# Patient Record
Sex: Male | Born: 1985 | Race: White | Hispanic: No | Marital: Married | Smoking: Never smoker
Health system: Southern US, Community
[De-identification: ages and names within clinical notes are randomized; demographics above are authoritative.]

## PROBLEM LIST (undated history)

## (undated) DIAGNOSIS — Z789 Other specified health status: Secondary | ICD-10-CM

## (undated) HISTORY — PX: NO PAST SURGERIES: SHX2092

## (undated) HISTORY — DX: Other specified health status: Z78.9

---

## 2020-01-13 ENCOUNTER — Ambulatory Visit: Payer: Self-pay | Admitting: Family Medicine

## 2020-01-21 ENCOUNTER — Other Ambulatory Visit: Payer: Self-pay

## 2020-01-21 ENCOUNTER — Ambulatory Visit (INDEPENDENT_AMBULATORY_CARE_PROVIDER_SITE_OTHER): Payer: Managed Care, Other (non HMO) | Admitting: Family Medicine

## 2020-01-21 ENCOUNTER — Encounter: Payer: Self-pay | Admitting: Family Medicine

## 2020-01-21 VITALS — BP 131/77 | HR 88 | Ht 67.0 in | Wt 191.0 lb

## 2020-01-21 DIAGNOSIS — G8929 Other chronic pain: Secondary | ICD-10-CM | POA: Insufficient documentation

## 2020-01-21 DIAGNOSIS — R6882 Decreased libido: Secondary | ICD-10-CM

## 2020-01-21 DIAGNOSIS — M5442 Lumbago with sciatica, left side: Secondary | ICD-10-CM

## 2020-01-21 DIAGNOSIS — M5440 Lumbago with sciatica, unspecified side: Secondary | ICD-10-CM

## 2020-01-21 DIAGNOSIS — Z13220 Encounter for screening for lipoid disorders: Secondary | ICD-10-CM

## 2020-01-21 DIAGNOSIS — Z0000 Encounter for general adult medical examination without abnormal findings: Secondary | ICD-10-CM

## 2020-01-21 NOTE — Assessment & Plan Note (Signed)
Will order updated xrays.  Will consider MRI of lumbar spine and/or PT as well if symptoms persist.

## 2020-01-21 NOTE — Patient Instructions (Signed)
Great to meet you today! Have labs and xray completed.  Follow up for annual exam after completion of labs.

## 2020-01-21 NOTE — Progress Notes (Signed)
Tony Oliver - 34 y.o. male MRN [id number]  Date of birth: 1985-08-17  Subjective No chief complaint on file.   HPI Tony Oliver is a 34 y.o. male here today for initial visit.  He has a history of chronic low back pain.  He recently moved to the area from [place].    Back pain located across center of back, often flares up more on R side.  He does often have sciatica that accompanies his flares of back pain.  He had xray completed while in [place] which showed some degenerative changes and disc space narrowing.  He has not had MRI or tried PT.   He would like have labs ordered for upcoming annual exam as well.   ROS:  A comprehensive ROS was completed and negative except as noted per HPI  No Known Allergies  Past Medical History:  Diagnosis Date  . No pertinent past medical history     Past Surgical History:  Procedure Laterality Date  . NO PAST SURGERIES      Social History   Socioeconomic History  . Marital status: Married    Spouse name: Not on file  . Number of children: Not on file  . Years of education: Not on file  . Highest education level: Not on file  Occupational History  . Not on file  Tobacco Use  . Smoking status: Never Smoker  . Smokeless tobacco: Never Used  Substance and Sexual Activity  . Alcohol use: Yes    Alcohol/week: 4.0 standard drinks    Types: 4 Standard drinks or equivalent per week  . Drug use: Never  . Sexual activity: Yes  Other Topics Concern  . Not on file  Social History Narrative  . Not on file   Social Determinants of Health   Financial Resource Strain:   . Difficulty of Paying Living Expenses: Not on file  Food Insecurity:   . Worried About Programme researcher, broadcasting/film/video in the Last Year: Not on file  . Ran Out of Food in the Last Year: Not on file  Transportation Needs:   . Lack of Transportation (Medical): Not on file  . Lack of Transportation (Non-Medical): Not on file  Physical Activity:   . Days of Exercise per Week: Not on  file  . Minutes of Exercise per Session: Not on file  Stress:   . Feeling of Stress : Not on file  Social Connections:   . Frequency of Communication with Friends and Family: Not on file  . Frequency of Social Gatherings with Friends and Family: Not on file  . Attends Religious Services: Not on file  . Active Member of Clubs or Organizations: Not on file  . Attends Banker Meetings: Not on file  . Marital Status: Not on file    History reviewed. No pertinent family history.  Health Maintenance  Topic Date Due  . Hepatitis C Screening  Never done  . HIV Screening  Never done  . TETANUS/TDAP  12/18/2029  . INFLUENZA VACCINE  Completed  . COVID-19 Vaccine  Completed     ----------------------------------------------------------------------------------------------------------------------------------------------------------------------------------------------------------------- Physical Exam BP 131/77   Pulse 88   Ht 5\' 7"  (1.702 m)   Wt 191 lb (86.6 kg)   BMI 29.91 kg/m   Physical Exam Constitutional:      Appearance: Normal appearance.  HENT:     Head: Normocephalic and atraumatic.  Eyes:     General: No scleral icterus. Cardiovascular:     Rate and Rhythm:  Normal rate and regular rhythm.  Pulmonary:     Effort: Pulmonary effort is normal.     Breath sounds: Normal breath sounds.  Musculoskeletal:     Cervical back: Neck supple.     Comments: ROM is pretty good in L spine.  SLR with mild pain.  Negative Faber test.    Neurological:     General: No focal deficit present.     Mental Status: He is alert.  Psychiatric:        Mood and Affect: Mood normal.        Behavior: Behavior normal.     ------------------------------------------------------------------------------------------------------------------------------------------------------------------------------------------------------------------- Assessment and Plan  Chronic bilateral low back  pain with sciatica Will order updated xrays.  Will consider MRI of lumbar spine and/or PT as well if symptoms persist.     No orders of the defined types were placed in this encounter.   No follow-ups on file.    This visit occurred during the SARS-CoV-2 public health emergency.  Safety protocols were in place, including screening questions prior to the visit, additional usage of staff PPE, and extensive cleaning of exam room while observing appropriate contact time as indicated for disinfecting solutions.

## 2020-01-22 ENCOUNTER — Ambulatory Visit (INDEPENDENT_AMBULATORY_CARE_PROVIDER_SITE_OTHER): Payer: Managed Care, Other (non HMO)

## 2020-01-22 DIAGNOSIS — M5440 Lumbago with sciatica, unspecified side: Secondary | ICD-10-CM | POA: Diagnosis not present

## 2020-01-22 DIAGNOSIS — G8929 Other chronic pain: Secondary | ICD-10-CM

## 2020-01-23 LAB — COMPLETE METABOLIC PANEL WITH GFR
AG Ratio: 1.9 (calc) (ref 1.0–2.5)
ALT: 45 U/L (ref 9–46)
AST: 25 U/L (ref 10–40)
Albumin: 4.8 g/dL (ref 3.6–5.1)
Alkaline phosphatase (APISO): 54 U/L (ref 36–130)
BUN: 13 mg/dL (ref 7–25)
CO2: 27 mmol/L (ref 20–32)
Calcium: 9.9 mg/dL (ref 8.6–10.3)
Chloride: 102 mmol/L (ref 98–110)
Creat: 0.87 mg/dL (ref 0.60–1.35)
GFR, Est African American: 131 mL/min/{1.73_m2} (ref >=60)
GFR, Est Non African American: 113 mL/min/{1.73_m2} (ref >=60)
Globulin: 2.5 g/dL (calc) (ref 1.9–3.7)
Glucose, Bld: 91 mg/dL (ref 65–99)
Potassium: 4.3 mmol/L (ref 3.5–5.3)
Sodium: 139 mmol/L (ref 135–146)
Total Bilirubin: 0.5 mg/dL (ref 0.2–1.2)
Total Protein: 7.3 g/dL (ref 6.1–8.1)

## 2020-01-23 LAB — CBC
HCT: 45.6 % (ref 38.5–50.0)
Hemoglobin: 14.8 g/dL (ref 13.2–17.1)
MCH: 27.7 pg (ref 27.0–33.0)
MCHC: 32.5 g/dL (ref 32.0–36.0)
MCV: 85.4 fL (ref 80.0–100.0)
MPV: 10.3 fL (ref 7.5–12.5)
Platelets: 288 10*3/uL (ref 140–400)
RBC: 5.34 10*6/uL (ref 4.20–5.80)
RDW: 12.6 % (ref 11.0–15.0)
WBC: 5.4 10*3/uL (ref 3.8–10.8)

## 2020-01-23 LAB — LIPID PANEL
Cholesterol: 211 mg/dL — ABNORMAL HIGH (ref <200)
HDL: 47 mg/dL (ref >=40)
LDL Cholesterol (Calc): 138 mg/dL (calc) — ABNORMAL HIGH
Non-HDL Cholesterol (Calc): 164 mg/dL (calc) — ABNORMAL HIGH (ref <130)
Total CHOL/HDL Ratio: 4.5 (calc) (ref <5.0)
Triglycerides: 136 mg/dL (ref <150)

## 2020-01-23 LAB — TESTOSTERONE: Testosterone: 260 ng/dL (ref 250–827)

## 2021-06-24 IMAGING — DX DG LUMBAR SPINE COMPLETE 4+V
5 series · 5 of 5 positions shown · non-contrast
Comparison: None.

CLINICAL DATA: Chronic low back pain.

EXAM:
LUMBAR SPINE - COMPLETE 4+ VIEW

[l-spine ap]
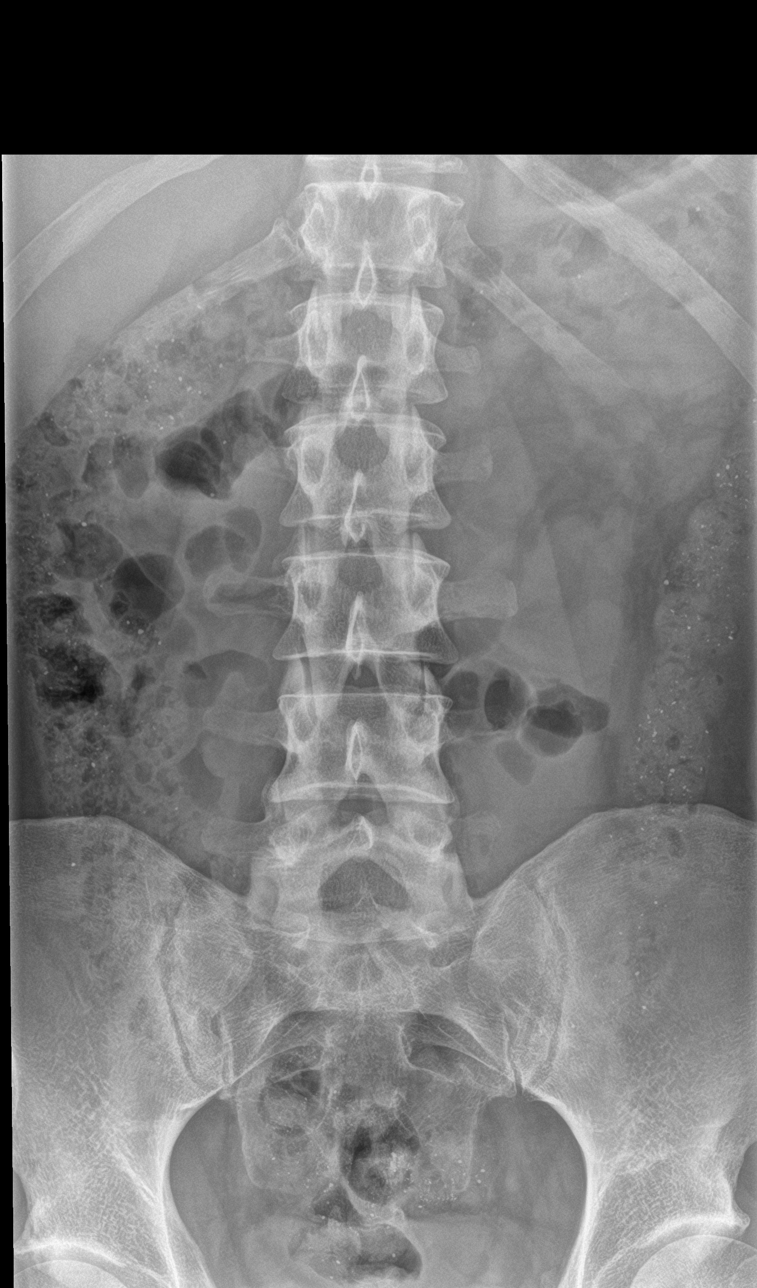

[l-spine obl (1 of 2)]
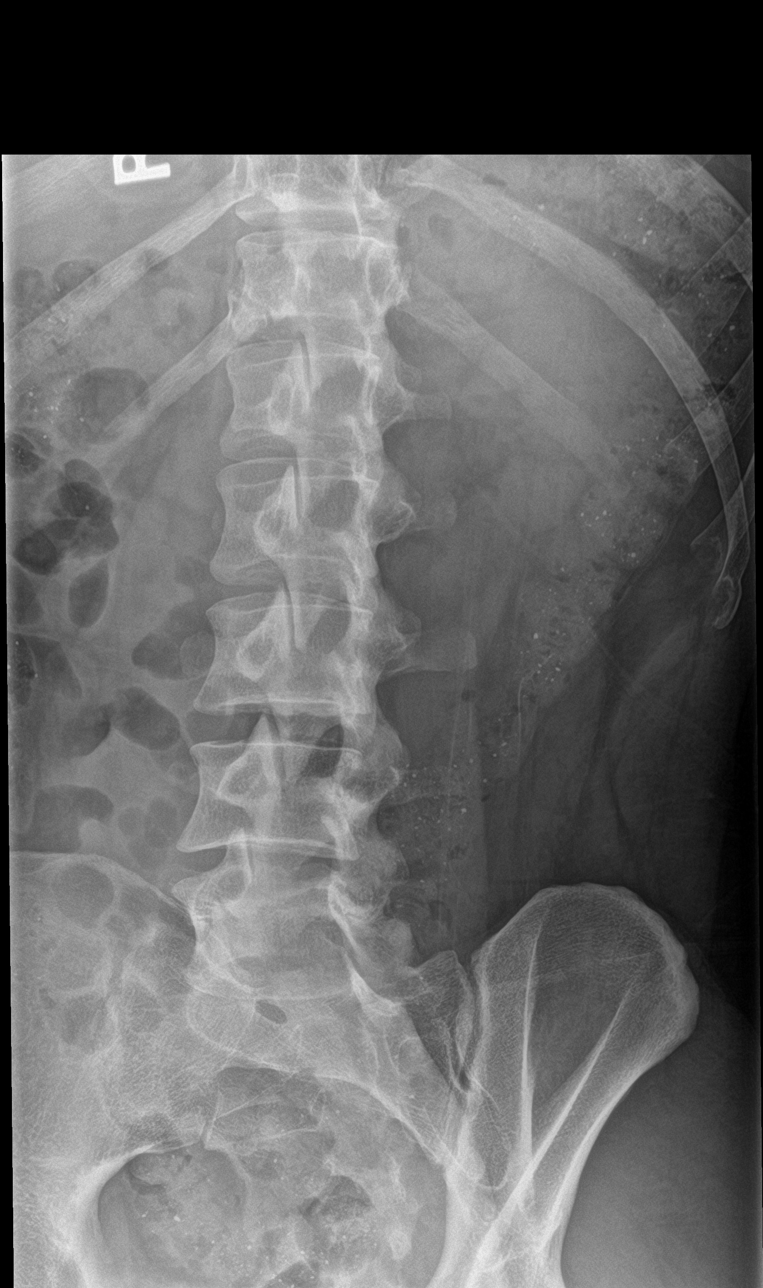

[l-spine obl (2 of 2)]
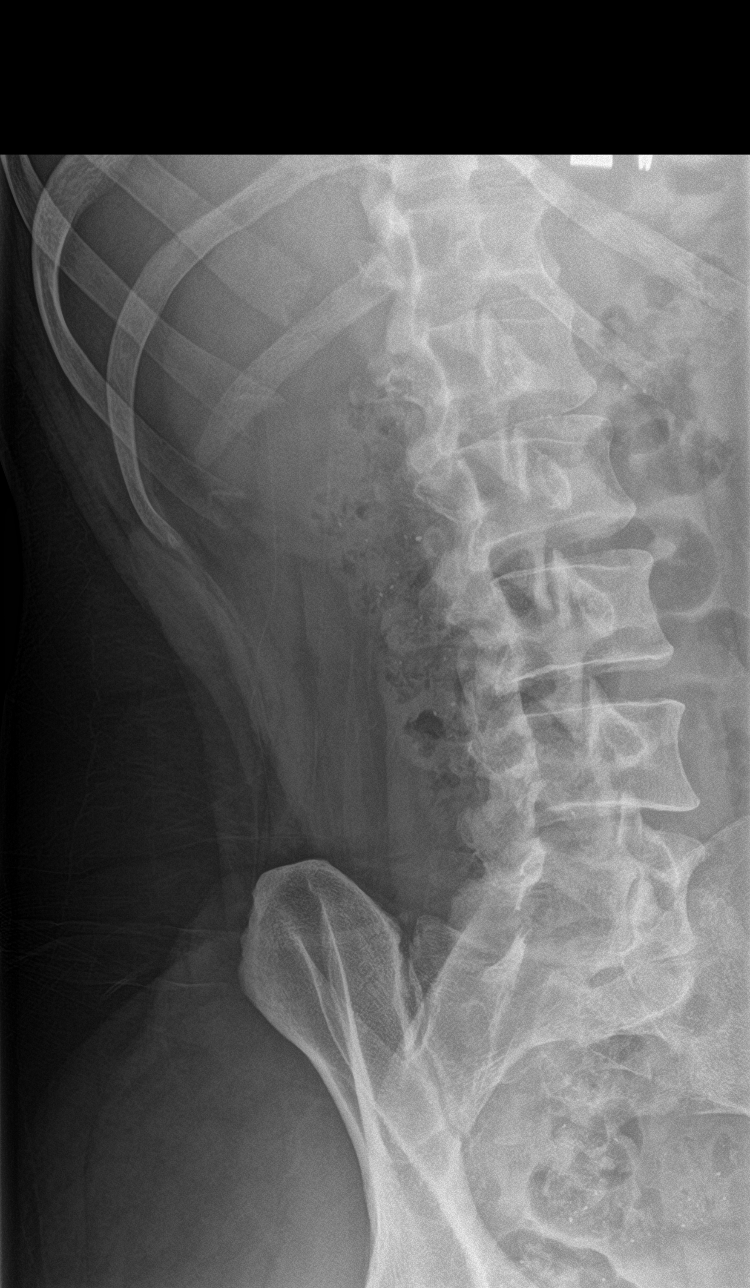

[l-spine lat]
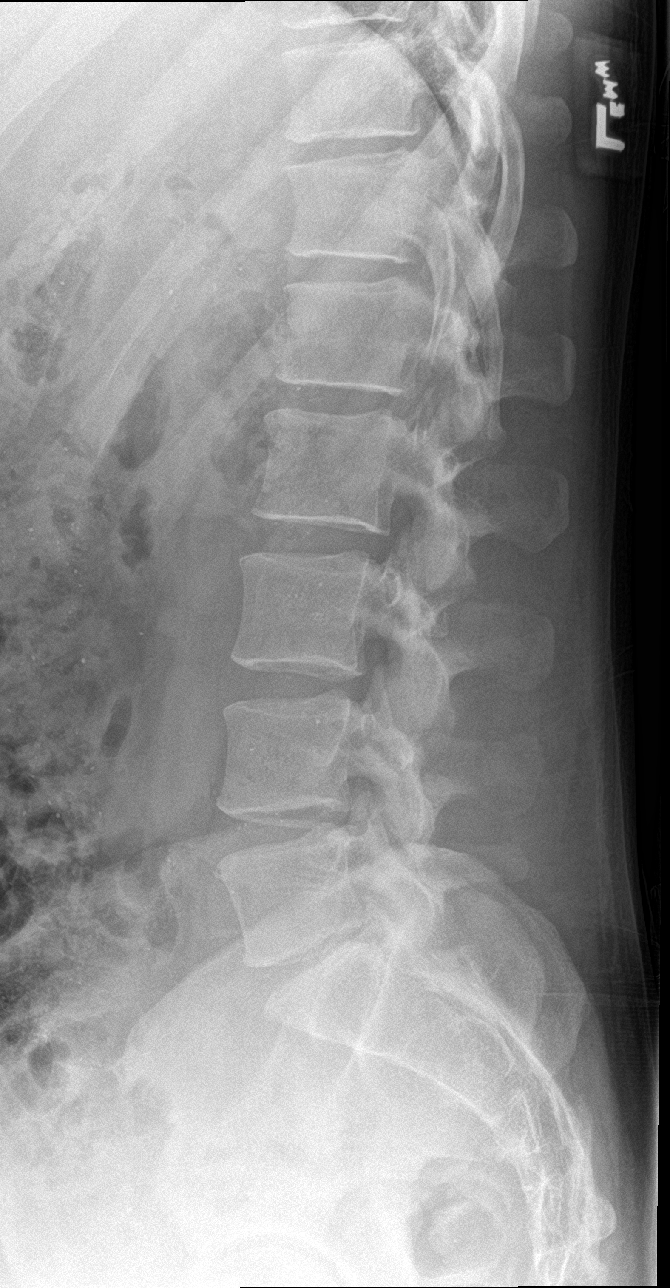

[l-spine spot]
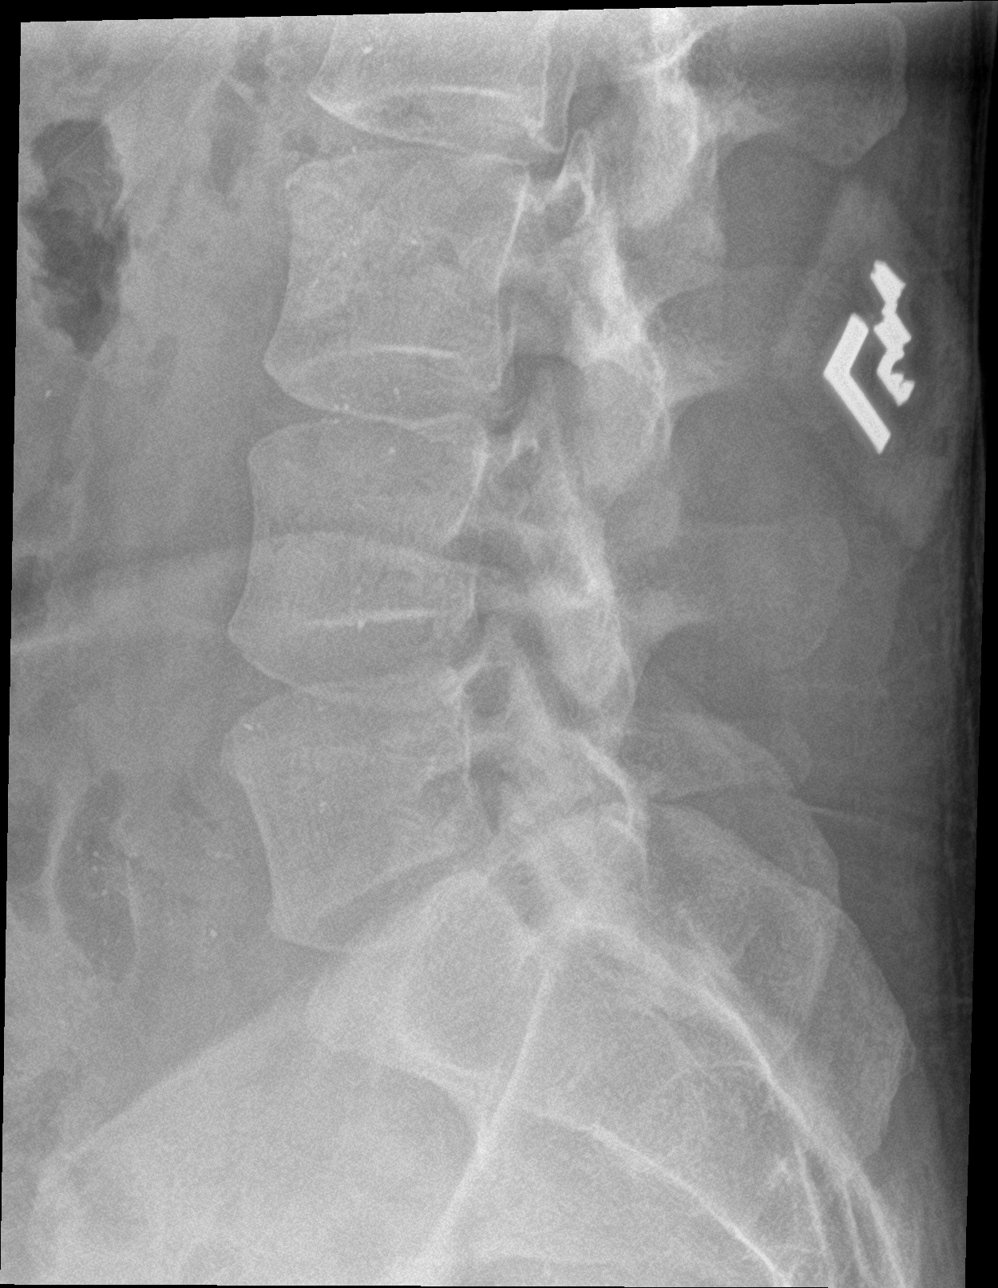

[5 of 5 positions shown; findings below may reference images not displayed]

FINDINGS: Normal alignment. The vertebral body heights are maintained. Mild
disc space narrowing at L5-S1. Moderate amount of stool in the
abdomen and pelvis. Normal appearance of the SI joints. Negative for
pars defect.
IMPRESSION: 1. No acute abnormality in lumbar spine.
2. Mild disc space narrowing at L5-S1.
